# Patient Record
Sex: Female | Born: 1990 | Race: White | Hispanic: No | Marital: Married
Health system: Southern US, Community
[De-identification: ages and names within clinical notes are randomized; demographics above are authoritative.]

---

## 2017-10-24 ENCOUNTER — Other Ambulatory Visit: Payer: Self-pay

## 2017-10-24 ENCOUNTER — Emergency Department
Admission: EM | Admit: 2017-10-24 | Discharge: 2017-10-24 | Disposition: A | Discharge status: Home / Self Care | Payer: BLUE CROSS/BLUE SHIELD | Source: Home / Self Care | Attending: Family Medicine | Admitting: Family Medicine

## 2017-10-24 ENCOUNTER — Emergency Department (INDEPENDENT_AMBULATORY_CARE_PROVIDER_SITE_OTHER): Payer: BLUE CROSS/BLUE SHIELD

## 2017-10-24 DIAGNOSIS — B9789 Other viral agents as the cause of diseases classified elsewhere: Secondary | ICD-10-CM | POA: Diagnosis not present

## 2017-10-24 DIAGNOSIS — R05 Cough: Secondary | ICD-10-CM | POA: Diagnosis not present

## 2017-10-24 DIAGNOSIS — R509 Fever, unspecified: Secondary | ICD-10-CM | POA: Diagnosis not present

## 2017-10-24 DIAGNOSIS — R062 Wheezing: Secondary | ICD-10-CM

## 2017-10-24 DIAGNOSIS — J069 Acute upper respiratory infection, unspecified: Secondary | ICD-10-CM

## 2017-10-24 MED ORDER — ALBUTEROL SULFATE HFA 108 (90 BASE) MCG/ACT IN AERS: 1.0000 | INHALATION_SPRAY | Freq: Four times a day (QID) | RESPIRATORY_TRACT | 0 refills | Status: AC | PRN

## 2017-10-24 MED ORDER — IBUPROFEN 400 MG PO TABS
400.0000 mg | ORAL_TABLET | Freq: Once | ORAL | Status: AC
  Administered 2017-10-24: 400 mg via ORAL

## 2017-10-24 MED ORDER — BENZONATATE 100 MG PO CAPS: 100.0000 mg | ORAL_CAPSULE | Freq: Three times a day (TID) | ORAL | 0 refills | Status: AC

## 2017-10-24 MED ORDER — IPRATROPIUM-ALBUTEROL 0.5-2.5 (3) MG/3ML IN SOLN
3.0000 mL | Freq: Once | RESPIRATORY_TRACT | Status: AC
  Administered 2017-10-24: 3 mL via RESPIRATORY_TRACT

## 2017-10-24 MED ORDER — OSELTAMIVIR PHOSPHATE 75 MG PO CAPS: 75.0000 mg | ORAL_CAPSULE | Freq: Two times a day (BID) | ORAL | 0 refills | Status: AC

## 2017-10-24 MED ORDER — PREDNISONE 20 MG PO TABS: ORAL_TABLET | ORAL | 0 refills | Status: AC

## 2017-10-24 NOTE — ED Triage Notes (Signed)
Started coughing on Thursday, didn't feel too bad Friday and worked.  Felt really bad with coughing, fever, and wheezing last night and today.

## 2017-10-24 NOTE — Discharge Instructions (Signed)
°  Oseltamivir (Tamiflu) may cause stomach upset including nausea, vomiting and diarrhea.  It may also cause dizziness or hallucinations in children.  To help prevent stomach upset, you may take this medication with food.  If you are still having unwanted symptoms, you may stop taking this medication as it is not as important to finish the entire course like antibiotics.  If you have questions/concerns please call our office or follow up with your primary care provider.    You may take 500mg  acetaminophen every 4-6 hours or in combination with ibuprofen 400-600mg  every 6-8 hours as needed for pain, inflammation, and fever.  Be sure to drink at least eight 8oz glasses of water to stay well hydrated and get at least 8 hours of sleep at night, preferably more while sick.   You have been prescribed 5 days of prednisone, an oral steroid to help with inflammation in your chest to help with the cough and wheeze.  You may start this medication tomorrow with breakfast as it can make it difficult to sleep if taken at night.

## 2017-10-24 NOTE — ED Provider Notes (Signed)
[facility]    CSN: [id number] Arrival date & time: 10/24/17  1647     History   Chief Complaint Chief Complaint  Patient presents with  . Cough  . Fever  . Wheezing    HPI Brenda Burgess is a 27 y.o. female.   HPI Brenda Burgess is a 27 y.o. female presenting to UC with c/o mildly productive cough that started 3 days ago, symptoms started to improve but then worsened again last night and throughout today.  Associated wheeze, chest tightness and fatigue.  She has tried OTC mucinex.  No hx of asthma or bronchitis.  Denies fever, chills, n/v/d but was found to have a fever in triage of 101*F this evening. No known sick contacts.     History reviewed. No pertinent past medical history.  There are no active problems to display for this patient.   Past Surgical History:  Procedure Laterality Date  . CESAREAN SECTION      OB History    No data available       Home Medications    Prior to Admission medications   Medication Sig Start Date End Date Taking? Authorizing Provider  sertraline (ZOLOFT) 50 MG tablet Take 50 mg by mouth daily.   Yes [provider]  albuterol (PROVENTIL HFA;VENTOLIN HFA) 108 (90 Base) MCG/ACT inhaler Inhale 1-2 puffs into the lungs every 6 (six) hours as needed for wheezing or shortness of breath. 10/24/17   Lurene Shadow, PA-C  benzonatate (TESSALON) 100 MG capsule Take 1-2 capsules (100-200 mg total) by mouth every 8 (eight) hours. 10/24/17   Lurene Shadow, PA-C  oseltamivir (TAMIFLU) 75 MG capsule Take 1 capsule (75 mg total) by mouth every 12 (twelve) hours. 10/24/17   Lurene Shadow, PA-C  predniSONE (DELTASONE) 20 MG tablet 3 tabs po day one, then 2 po daily x 4 days 10/24/17   Lurene Shadow, PA-C    Family History History reviewed. No pertinent family history.  Social History Social History   Tobacco Use  . Smoking status: Not on file  Substance Use Topics  . Alcohol use: Not on file  . Drug use: Not on file      Allergies   Sulfa antibiotics   Review of Systems Review of Systems  Constitutional: Negative for chills and fever.  HENT: Positive for congestion and rhinorrhea. Negative for ear pain, sore throat, trouble swallowing and voice change.   Respiratory: Positive for cough, chest tightness and wheezing. Negative for shortness of breath.   Cardiovascular: Negative for chest pain and palpitations.  Gastrointestinal: Negative for abdominal pain, diarrhea, nausea and vomiting.  Musculoskeletal: Negative for arthralgias, back pain and myalgias.  Skin: Negative for rash.  Neurological: Positive for headaches. Negative for dizziness and light-headedness.     Physical Exam Triage Vital Signs ED Triage Vitals  Enc Vitals Group     BP 10/24/17 1810 134/88     Pulse Rate 10/24/17 1810 (!) 118     Resp --      Temp 10/24/17 1810 (!) 101.1 F (38.4 C)     Temp Source 10/24/17 1810 Oral     SpO2 10/24/17 1810 97 %     Weight 10/24/17 1813 224 lb (101.6 kg)     Height 10/24/17 1813 5\' 6"  (1.676 m)     Head Circumference --      Peak Flow --      Pain Score 10/24/17 1813 3     Pain Loc --  Pain Edu? --      Excl. in GC? --    No data found.  Updated Vital Signs BP 134/88 (BP Location: Right Arm)   Pulse (!) 118   Temp (!) 101.1 F (38.4 C) (Oral)   Ht 5\' 6"  (1.676 m)   Wt 224 lb (101.6 kg)   LMP 09/27/2017   SpO2 97%   BMI 36.15 kg/m   Visual Acuity Right Eye Distance:   Left Eye Distance:   Bilateral Distance:    Right Eye Near:   Left Eye Near:    Bilateral Near:     Physical Exam  Constitutional: She is oriented to person, place, and time. She appears well-developed and well-nourished. No distress.  HENT:  Head: Normocephalic and atraumatic.  Right Ear: Tympanic membrane normal.  Left Ear: Tympanic membrane normal.  Nose: Nose normal. Right sinus exhibits no maxillary sinus tenderness and no frontal sinus tenderness. Left sinus exhibits no maxillary sinus  tenderness and no frontal sinus tenderness.  Mouth/Throat: Uvula is midline, oropharynx is clear and moist and mucous membranes are normal.  Eyes: EOM are normal.  Neck: Normal range of motion. Neck supple.  Cardiovascular: Regular rhythm. Tachycardia present.  Pulmonary/Chest: Effort normal. No stridor. No respiratory distress. She has wheezes. She has rales.  Musculoskeletal: Normal range of motion.  Lymphadenopathy:    She has no cervical adenopathy.  Neurological: She is alert and oriented to person, place, and time.  Skin: Skin is warm and dry. She is not diaphoretic.  Psychiatric: She has a normal mood and affect. Her behavior is normal.  Nursing note and vitals reviewed.    UC Treatments / Results  Labs (all labs ordered are listed, but only abnormal results are displayed) Labs Reviewed - No data to display  EKG  EKG Interpretation None       Radiology Dg Chest 2 View  Result Date: 10/24/2017 CLINICAL DATA:  Cough, fever, body aches, and wheezing for 2 days. EXAM: CHEST  2 VIEW COMPARISON:  None. FINDINGS: The heart size and mediastinal contours are within normal limits. Both lungs are clear. The visualized skeletal structures are unremarkable. IMPRESSION: Negative.  No active cardiopulmonary disease. Electronically Signed   By: Myles Rosenthal M.D.   On: 10/24/2017 18:42    Procedures Procedures (including critical care time)  Medications Ordered in UC Medications  ibuprofen (ADVIL,MOTRIN) tablet 400 mg (400 mg Oral Given 10/24/17 1824)  ipratropium-albuterol (DUONEB) 0.5-2.5 (3) MG/3ML nebulizer solution 3 mL (3 mLs Nebulization Given 10/24/17 1840)     Initial Impression / Assessment and Plan / UC Course  I have reviewed the triage vital signs and the nursing notes.  Pertinent labs & imaging results that were available during my care of the patient were reviewed by me and considered in my medical decision making (see chart for details).     CXR: no acute  cardiopulmonary findings Solumedrol and duoneb given in UC, moderate improvement of lung sounds.  Pt feels somewhat better. Due to time of year and sudden onset of symptoms, will cover for influenza with Tamiflu  Home care instructions provided F/u with PCP in 1 week if not improving Discussed symptoms that warrant emergent care in the ED.   Final Clinical Impressions(s) / UC Diagnoses   Final diagnoses:  Viral URI with cough  Wheeze    ED Discharge Orders        Ordered    oseltamivir (TAMIFLU) 75 MG capsule  Every 12 hours  10/24/17 1903    benzonatate (TESSALON) 100 MG capsule  Every 8 hours     10/24/17 1903    predniSONE (DELTASONE) 20 MG tablet     10/24/17 1903    albuterol (PROVENTIL HFA;VENTOLIN HFA) 108 (90 Base) MCG/ACT inhaler  Every 6 hours PRN     10/24/17 1903       Controlled Substance Prescriptions [place] Controlled Substance Registry consulted? Not Applicable   Rolla Platehelps, Deborah Dondero O, PA-C 10/25/17 [id number]

## 2019-04-20 IMAGING — DX DG CHEST 2V
2 series · 2 of 2 positions shown · non-contrast
Comparison: None.

CLINICAL DATA: Cough, fever, body aches, and wheezing for 2 days.

EXAM:
CHEST  2 VIEW

[chest pa]
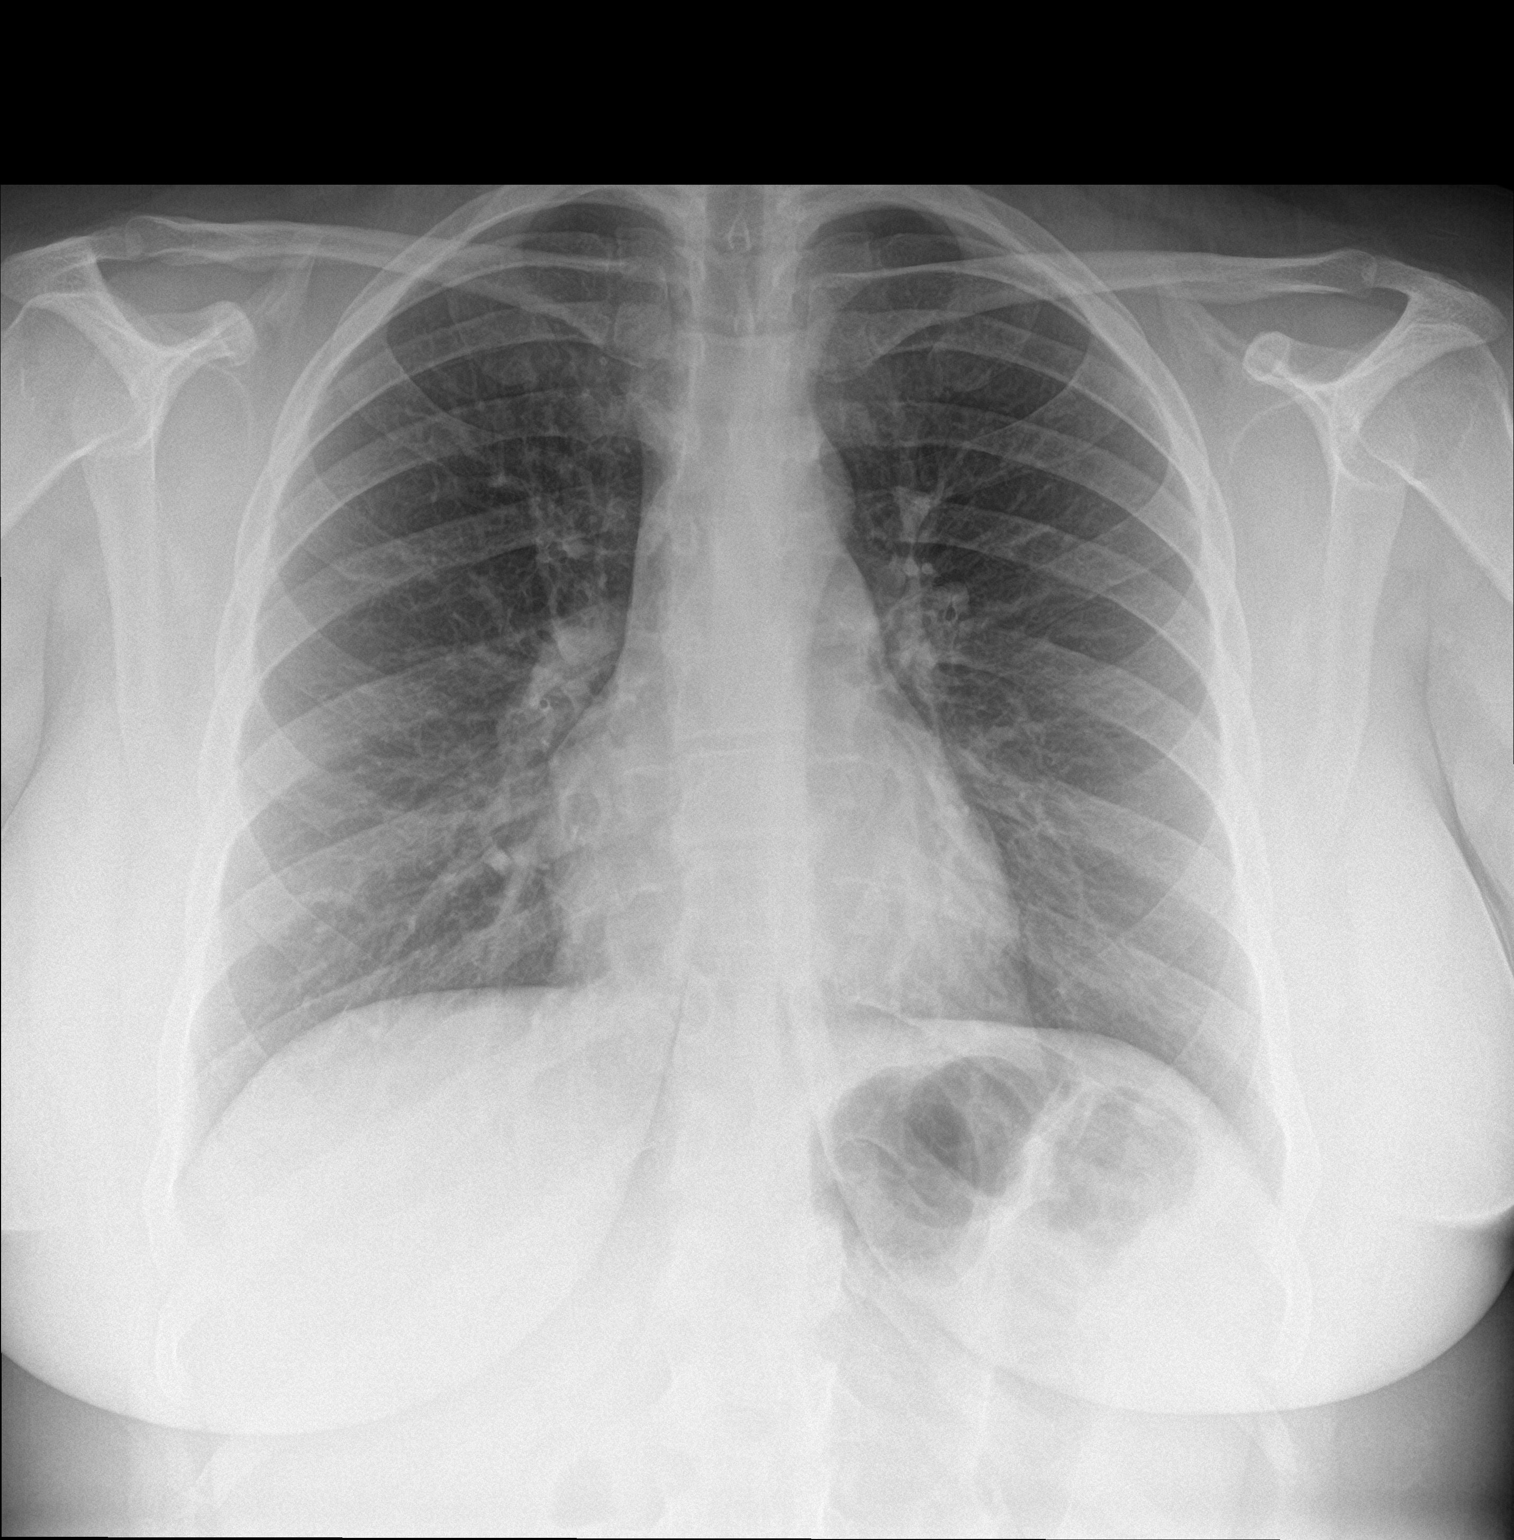

[chest lat]
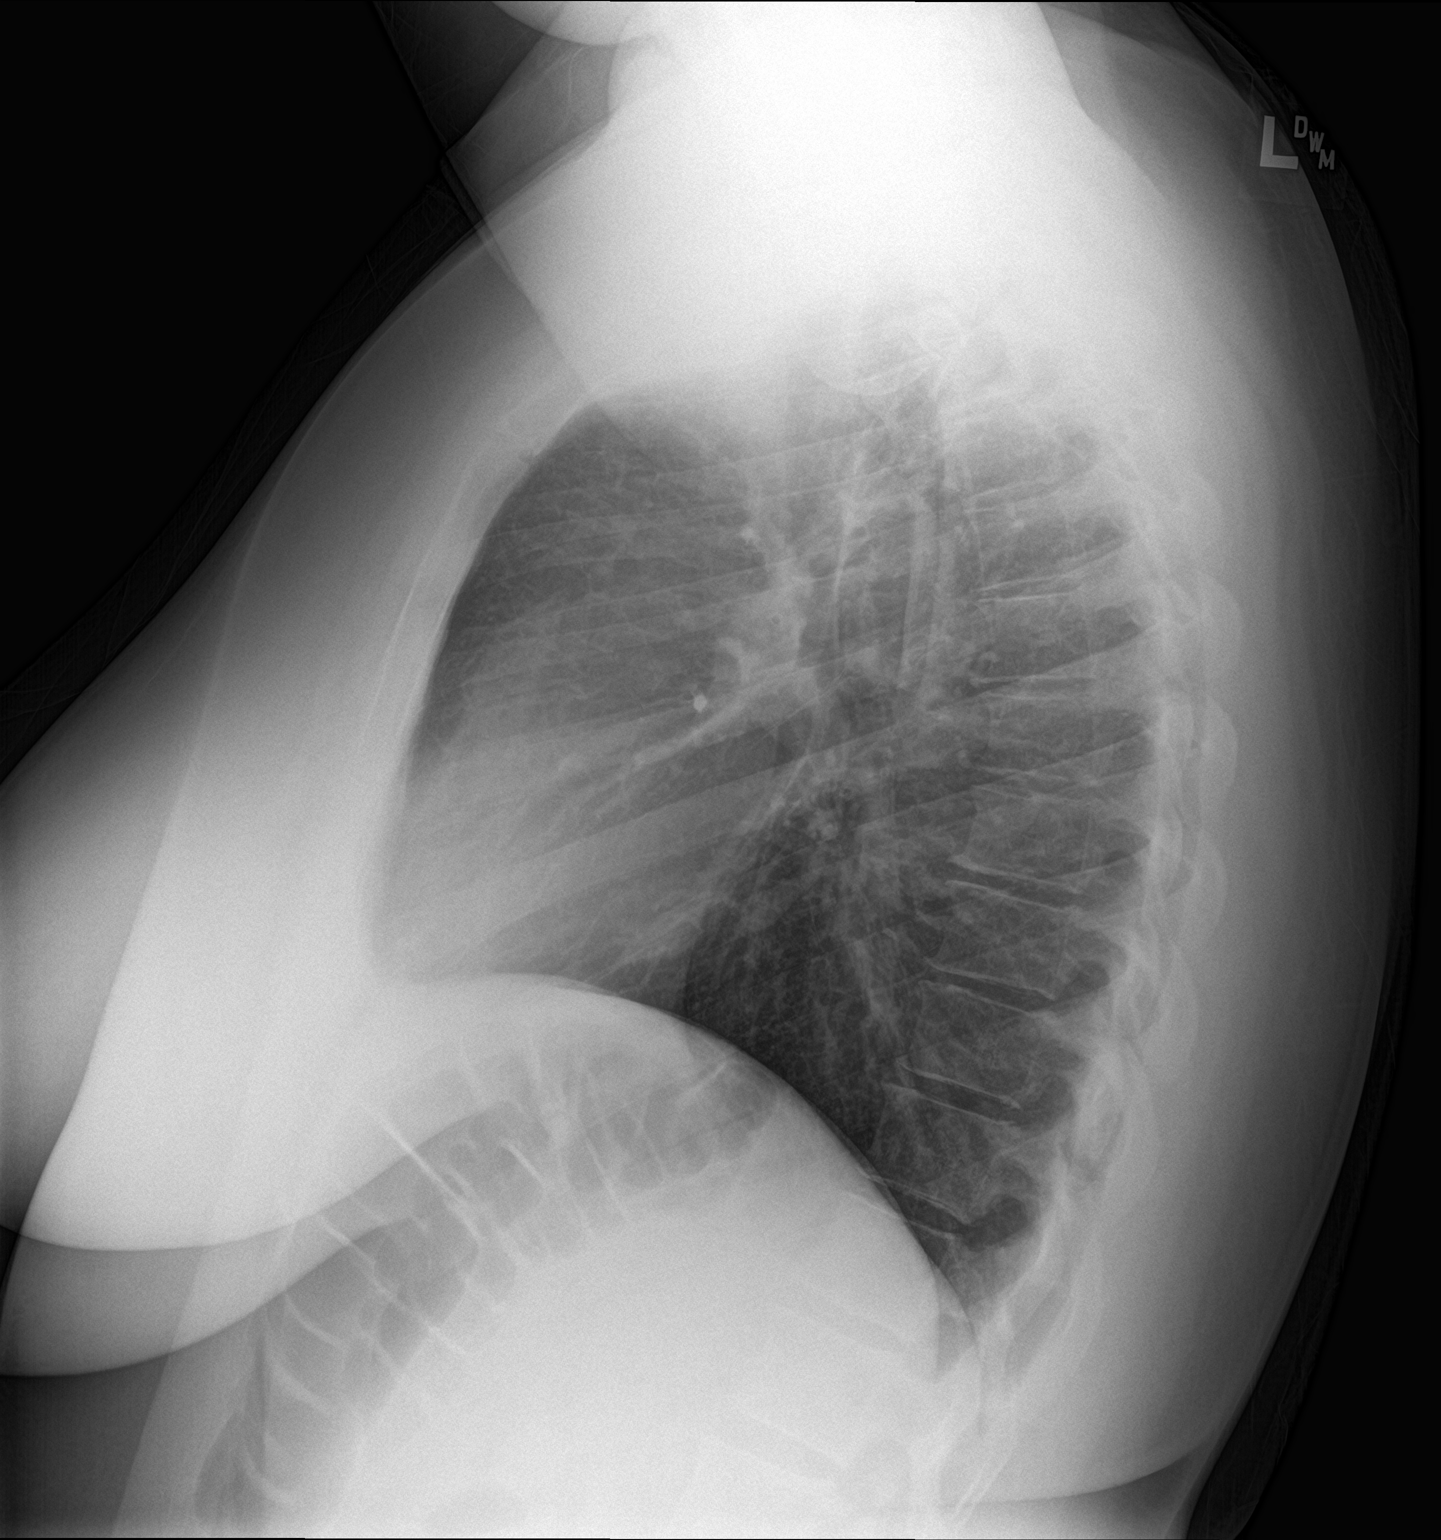

[2 of 2 positions shown; findings below may reference images not displayed]

FINDINGS: The heart size and mediastinal contours are within normal limits.
Both lungs are clear. The visualized skeletal structures are
unremarkable.
IMPRESSION: Negative.  No active cardiopulmonary disease.
